# Patient Record
Sex: Female | Born: 1980 | Hispanic: Yes | Marital: Married | Smoking: Never smoker
Health system: Southern US, Community
[De-identification: ages and names within clinical notes are randomized; demographics above are authoritative.]

## PROBLEM LIST (undated history)

## (undated) DIAGNOSIS — N920 Excessive and frequent menstruation with regular cycle: Secondary | ICD-10-CM

## (undated) DIAGNOSIS — K219 Gastro-esophageal reflux disease without esophagitis: Secondary | ICD-10-CM

## (undated) DIAGNOSIS — E538 Deficiency of other specified B group vitamins: Secondary | ICD-10-CM

## (undated) DIAGNOSIS — D250 Submucous leiomyoma of uterus: Secondary | ICD-10-CM

## (undated) HISTORY — PX: BUNIONECTOMY: SHX129

---

## 2016-05-21 ENCOUNTER — Encounter (INDEPENDENT_AMBULATORY_CARE_PROVIDER_SITE_OTHER): Payer: Self-pay

## 2016-05-21 ENCOUNTER — Ambulatory Visit (INDEPENDENT_AMBULATORY_CARE_PROVIDER_SITE_OTHER): Payer: BLUE CROSS/BLUE SHIELD

## 2016-05-21 ENCOUNTER — Ambulatory Visit (INDEPENDENT_AMBULATORY_CARE_PROVIDER_SITE_OTHER): Payer: BLUE CROSS/BLUE SHIELD | Admitting: Orthopedic Surgery

## 2016-05-21 ENCOUNTER — Encounter (INDEPENDENT_AMBULATORY_CARE_PROVIDER_SITE_OTHER): Payer: Self-pay | Admitting: Orthopedic Surgery

## 2016-05-21 DIAGNOSIS — M21611 Bunion of right foot: Secondary | ICD-10-CM | POA: Diagnosis not present

## 2016-05-21 DIAGNOSIS — M21612 Bunion of left foot: Secondary | ICD-10-CM

## 2016-05-21 NOTE — Progress Notes (Signed)
Office Visit Note   Patient: Stacy Petersen           Date of Birth: 10-28-1980           MRN: [id number] Visit Date: 05/21/2016              Requested by: Nanci Pina, MD [address] [place], [place] [address] PCP: Nanci Pina, MD  Chief Complaint  Patient presents with  . Lower Back - Pain  . Right Foot - Follow-up    bunion  . Left Foot - Follow-up    bunion      HPI: Patient is a 36 year old woman who presents with 2 separate issues #1 she has chronic lower back pain and now is having radicular pain into her right buttocks. She has had an MRI scan is reviewed which shows a grade 2 anterior listhesis L5 on S1. Patient does have stenosis area patient also presents for bunion deformity and pain bilateral feet worse on the left than the right. She states she had bunion surgery on the left foot when she was 36 years old and states that this has not provided her any relief or resolution of the deformity.  Assessment & Plan: Visit Diagnoses:  1. Bunion of great toe of right foot   2. Bunion of great toe of left foot     Plan: She will follow up with neurosurgery in [facility] for evaluation for a grade 2 spondylolisthesis. Patient inquires regarding orthopedic surgeons performing the lumbar spine surgery and I discussed that there are several orthopedic doctors in [place] that perform this type of surgery. Patient states that with her bunion pain left worse than right she would like to proceed with surgical intervention.  She would like to proceed with surgery in August after her children have started back to school. Would plan for surgery on the left foot first with a Chevron and Akin osteotomy bunion deformity left great toe MTP joint. Risks and benefits were discussed patient states she understands and wishes to proceed at this time she would then like on the right bunion.  Follow-Up Instructions: Return if symptoms worsen or fail to improve.    Ortho Exam  Patient is alert, oriented, no adenopathy, well-dressed, normal affect, normal respiratory effort. Patient has normal gait she has a palpable pulse bilaterally she has good ankle and subtalar motion negative straight leg raise bilaterally no focal motor weakness in either lower extremity. She has a prominent bunion deformity left worse than the right with a scar on the medial dorsal aspect of the left great toe MTP joint. She has dorsiflexion of both great toes to about 45 but does not have pain with range of motion. There is large bony spurs medially on both feet. There is redness with callus formation bilaterally great toe MTP joint.  Imaging: Xr Foot 2 Views Left  Result Date: 05/21/2016 Two-view radiographs of the left foot shows what appears to be a sclerotic region proximally and the first metatarsal where a opening wedge osteotomy may have been attempted. There is no change in the alignment of the first metatarsal. There is also a distal ostectomy with no evidence of a Chevron osteotomy appears to be more of a non-congruent Silver osteotomy. The joint space is congruent.  Xr Foot 2 Views Right  Result Date: 05/21/2016 Two-view radiographs of the right foot shows a moderate hallux valgus deformity the joint space is congruent bony prominence medially with subluxation of the sesamoids  Labs: No results found for: HGBA1C, ESRSEDRATE, CRP, LABURIC, REPTSTATUS, GRAMSTAIN, CULT, LABORGA  Orders:  Orders Placed This Encounter  Procedures  . XR Foot 2 Views Left  . XR Foot 2 Views Right   No orders of the defined types were placed in this encounter.    Procedures: No procedures performed  Clinical Data: No additional findings.  ROS:  All other systems negative, except as noted in the HPI. Review of Systems  Objective: Vital Signs: There were no vitals taken for this visit.  Specialty Comments:  No specialty comments available.  PMFS History: Patient Active  Problem List   Diagnosis Date Noted  . Bunion of great toe of left foot 05/21/2016  . Bunion of great toe of right foot 05/21/2016   History reviewed. No pertinent past medical history.  History reviewed. No pertinent family history.  History reviewed. No pertinent surgical history. Social History   Occupational History  . Not on file.   Social History Main Topics  . Smoking status: Never Smoker  . Smokeless tobacco: Never Used  . Alcohol use Not on file  . Drug use: Unknown  . Sexual activity: Not on file

## 2016-06-26 ENCOUNTER — Ambulatory Visit (INDEPENDENT_AMBULATORY_CARE_PROVIDER_SITE_OTHER): Payer: Self-pay | Admitting: Orthopaedic Surgery

## 2016-09-18 ENCOUNTER — Telehealth (INDEPENDENT_AMBULATORY_CARE_PROVIDER_SITE_OTHER): Payer: Self-pay | Admitting: Orthopedic Surgery

## 2016-09-18 NOTE — Telephone Encounter (Signed)
Patient called wanting to speak with you about setting up her surgery as soon as possible. Thank you. [phone]

## 2016-09-24 NOTE — Telephone Encounter (Signed)
Can you please review her last office note and complete a surgery sheet for me? She was seen in April, but I do not have one.  Also, she would like cost estimate before scheduling, so we will code and obtain that for her. Thank you

## 2016-09-25 NOTE — Telephone Encounter (Signed)
Blue sheet completed.

## 2016-10-29 ENCOUNTER — Telehealth (INDEPENDENT_AMBULATORY_CARE_PROVIDER_SITE_OTHER): Payer: Self-pay

## 2016-10-29 NOTE — Telephone Encounter (Signed)
Patient would like to know if it would be okay to have Lasik eye surgery on 11/15/16 and also have surgery on Tuesday 11/20/16 with Dr. Sharol Given.  Cb# [phone].  Please advise. Thank You.

## 2016-10-29 NOTE — Telephone Encounter (Signed)
This should be okay. Just have them check and make sure with their ophthalmologist that there is not any restrictions with having surgery several days after her Lasix treatment.

## 2016-10-30 NOTE — Telephone Encounter (Signed)
I called and spoke with patient advising her of the message below.

## 2016-11-20 DIAGNOSIS — M2012 Hallux valgus (acquired), left foot: Secondary | ICD-10-CM | POA: Diagnosis not present

## 2016-11-28 ENCOUNTER — Inpatient Hospital Stay (INDEPENDENT_AMBULATORY_CARE_PROVIDER_SITE_OTHER): Payer: BLUE CROSS/BLUE SHIELD | Admitting: Orthopedic Surgery

## 2016-11-29 ENCOUNTER — Encounter (INDEPENDENT_AMBULATORY_CARE_PROVIDER_SITE_OTHER): Payer: Self-pay | Admitting: Orthopedic Surgery

## 2016-11-29 ENCOUNTER — Ambulatory Visit (INDEPENDENT_AMBULATORY_CARE_PROVIDER_SITE_OTHER): Payer: BLUE CROSS/BLUE SHIELD | Admitting: Orthopedic Surgery

## 2016-11-29 DIAGNOSIS — M21612 Bunion of left foot: Secondary | ICD-10-CM

## 2016-11-29 NOTE — Progress Notes (Signed)
   Office Visit Note   Patient: Stacy Petersen           Date of Birth: [birth year]           MRN: [id number] Visit Date: 11/29/2016              Requested by: Nanci Pina, MD [place], [place] [address] PCP: Nanci Pina, MD  Chief Complaint  Patient presents with  . Left Foot - Routine Post Op    11/20/16 Left Foot GT Chevron Aiken Osteotomy 9 days post op.      HPI: Patient is a 36 year old woman who presents in follow-up status post bunion surgery left great toe with a Chevron osteotomy and Akin osteotomy. Patient is pleased with her toe she states it straight she is not wearing her postoperative shoe.  Assessment & Plan: Visit Diagnoses:  1. Bunion of great toe of left foot     Plan: recommend that she wear the postoperative shoe continue nonweightbearing and use the shoe to protect the toe from blunt trauma. Continue working on dorsiflexion of the ankle. Recommended starting Dial soap cleansing with antibiotic ointment over the pin tracks and over the incision with 4 x 4 and a loosely wrapped Ace wrap. Plan follow-up in 1 week at which time we will remove the sutures and pins.  Follow-Up Instructions: Return in about 1 week (around 12/06/2016).   Ortho Exam  Patient is alert, oriented, no adenopathy, well-dressed, normal affect, normal respiratory effort. Examination the incision is healing quite nicely there is no redness no cellulitis no wound dehiscence noted drainage. Her toe is straight the pin tracks are clean and dry no redness around the pin tracks.  Imaging: No results found. No images are attached to the encounter.  Labs: No results found for: HGBA1C, ESRSEDRATE, CRP, LABURIC, REPTSTATUS, GRAMSTAIN, CULT, LABORGA  Orders:  No orders of the defined types were placed in this encounter.  No orders of the defined types were placed in this encounter.    Procedures: No procedures performed  Clinical Data: No additional  findings.  ROS:  All other systems negative, except as noted in the HPI. Review of Systems  Objective: Vital Signs: There were no vitals taken for this visit.  Specialty Comments:  No specialty comments available.  PMFS History: Patient Active Problem List   Diagnosis Date Noted  . Bunion of great toe of left foot 05/21/2016  . Bunion of great toe of right foot 05/21/2016   History reviewed. No pertinent past medical history.  History reviewed. No pertinent family history.  History reviewed. No pertinent surgical history. Social History   Occupational History  . Not on file.   Social History Main Topics  . Smoking status: Never Smoker  . Smokeless tobacco: Never Used  . Alcohol use Not on file  . Drug use: Unknown  . Sexual activity: Not on file

## 2016-12-06 ENCOUNTER — Ambulatory Visit (INDEPENDENT_AMBULATORY_CARE_PROVIDER_SITE_OTHER): Payer: BLUE CROSS/BLUE SHIELD | Admitting: Orthopedic Surgery

## 2016-12-06 DIAGNOSIS — M21612 Bunion of left foot: Secondary | ICD-10-CM

## 2016-12-06 NOTE — Progress Notes (Signed)
   Office Visit Note   Patient: Stacy Petersen           Date of Birth: 1980-09-09           MRN: [id number] Visit Date: 12/06/2016              Requested by: Nanci Pina, MD [place], [place] [address] PCP: Nanci Pina, MD  No chief complaint on file.     HPI: Patient is a 36 year old woman who presents 2 weeks status post chevron and Akin osteotomy for bunion left great toe.  Assessment & Plan: Visit Diagnoses:  1. Bunion of great toe of left foot     Plan: The pins and sutures were removed.  The importance of scar massage was discussed to minimize risk of scar keloiding.  Patient states she does have a tendency to keloid.  She may advance weightbearing as tolerated with her postoperative shoe and crutches.  Patient was given a note to continue her out of work note for 4 weeks.  We will need to complete her FMLA to agree with her new out of work date.  Follow-Up Instructions: Return in about 2 weeks (around 12/20/2016).   Ortho Exam  Patient is alert, oriented, no adenopathy, well-dressed, normal affect, normal respiratory effort. Examination incision is well-healed there is no keloiding no redness no cellulitis no signs of infection.  The pins were removed her toe was straight a small spacer was placed in the first webspace.  Imaging: No results found. No images are attached to the encounter.  Labs: No results found for: HGBA1C, ESRSEDRATE, CRP, LABURIC, REPTSTATUS, GRAMSTAIN, CULT, LABORGA  Orders:  No orders of the defined types were placed in this encounter.  No orders of the defined types were placed in this encounter.    Procedures: No procedures performed  Clinical Data: No additional findings.  ROS:  All other systems negative, except as noted in the HPI. Review of Systems  Objective: Vital Signs: There were no vitals taken for this visit.  Specialty Comments:  No specialty comments available.  PMFS  History: Patient Active Problem List   Diagnosis Date Noted  . Bunion of great toe of left foot 05/21/2016  . Bunion of great toe of right foot 05/21/2016   No past medical history on file.  No family history on file.  No past surgical history on file. Social History   Occupational History  . Not on file.   Social History Main Topics  . Smoking status: Never Smoker  . Smokeless tobacco: Never Used  . Alcohol use Not on file  . Drug use: Unknown  . Sexual activity: Not on file

## 2016-12-17 ENCOUNTER — Telehealth (INDEPENDENT_AMBULATORY_CARE_PROVIDER_SITE_OTHER): Payer: Self-pay | Admitting: Orthopedic Surgery

## 2016-12-17 NOTE — Telephone Encounter (Signed)
refaxed 12/06/2016 disability forms to [employer]. Originally faxed by [employer] [phone]

## 2016-12-20 ENCOUNTER — Encounter (INDEPENDENT_AMBULATORY_CARE_PROVIDER_SITE_OTHER): Payer: Self-pay | Admitting: Orthopedic Surgery

## 2016-12-20 ENCOUNTER — Ambulatory Visit (INDEPENDENT_AMBULATORY_CARE_PROVIDER_SITE_OTHER): Payer: BLUE CROSS/BLUE SHIELD | Admitting: Orthopedic Surgery

## 2016-12-20 DIAGNOSIS — M21612 Bunion of left foot: Secondary | ICD-10-CM

## 2016-12-20 NOTE — Progress Notes (Signed)
   Office Visit Note   Patient: Stacy Petersen           Date of Birth: October 06, 1980           MRN: [id number] Visit Date: 12/20/2016              Requested by: Nanci Pina, MD [place], [place] [address] PCP: Nanci Pina, MD  Chief Complaint  Patient presents with  . Right Foot - Follow-up    11/20/16 Chevron & Aiken Osteotomy L GT      HPI: Patient is a 36 year old woman who presents 4 weeks status post chevron and Akin osteotomy left great toe for bunion deformity.  Patient states that she stands for more than 30 minutes she has increased swelling and purple discoloration of the skin.  She is currently full weightbearing in a postoperative shoe she states that her foot will not fit into regular sneakers yet.  Assessment & Plan: Visit Diagnoses:  1. Bunion of great toe of left foot     Plan: Continue with the postoperative shoe continue with scar massage work on heel cord stretching follow-up in 2 weeks at that time we will evaluate her ability to return to work she states she is on her feet a lot during the day.  She could not return to work in her current condition out of work for at least 2 weeks.  Follow-Up Instructions: Return in about 2 weeks (around 01/03/2017).   Ortho Exam  Patient is alert, oriented, no adenopathy, well-dressed, normal affect, normal respiratory effort. Examination the incision is well-healed she does have swelling in the forefoot.  There is no purple discoloration this morning.  She has good pulses she does have a little bit heel cord tightness and she was given instructions for heel cord stretching she will work on scar massage she has a little bit of stiffness from the swelling of the great toe and she will work on range of motion of the great toe.  She has good alignment of the toe with no clinical angular deformity.  Do not feel x-rays are necessary at this time.  Imaging: No results found. No images are  attached to the encounter.  Labs: No results found for: HGBA1C, ESRSEDRATE, CRP, LABURIC, REPTSTATUS, GRAMSTAIN, CULT, LABORGA  Orders:  No orders of the defined types were placed in this encounter.  No orders of the defined types were placed in this encounter.    Procedures: No procedures performed  Clinical Data: No additional findings.  ROS:  All other systems negative, except as noted in the HPI. Review of Systems  Objective: Vital Signs: There were no vitals taken for this visit.  Specialty Comments:  No specialty comments available.  PMFS History: Patient Active Problem List   Diagnosis Date Noted  . Bunion of great toe of left foot 05/21/2016  . Bunion of great toe of right foot 05/21/2016   History reviewed. No pertinent past medical history.  History reviewed. No pertinent family history.  History reviewed. No pertinent surgical history. Social History   Occupational History  . Not on file  Tobacco Use  . Smoking status: Never Smoker  . Smokeless tobacco: Never Used  Substance and Sexual Activity  . Alcohol use: Not on file  . Drug use: Not on file  . Sexual activity: Not on file

## 2017-01-07 ENCOUNTER — Ambulatory Visit (INDEPENDENT_AMBULATORY_CARE_PROVIDER_SITE_OTHER): Payer: BLUE CROSS/BLUE SHIELD | Admitting: Orthopedic Surgery

## 2017-01-07 ENCOUNTER — Encounter (INDEPENDENT_AMBULATORY_CARE_PROVIDER_SITE_OTHER): Payer: Self-pay | Admitting: Orthopedic Surgery

## 2017-01-07 DIAGNOSIS — M21612 Bunion of left foot: Secondary | ICD-10-CM

## 2017-01-07 NOTE — Progress Notes (Signed)
   Office Visit Note   Patient: Stacy Petersen           Date of Birth: May 28, 1980           MRN: [id number] Visit Date: 01/07/2017              Requested by: Nanci Pina, MD [place], [place] [address] PCP: Nanci Pina, MD  Chief Complaint  Patient presents with  . Left Foot - Routine Post Op    11/20/16  chevron and aiken osteotomy GT      HPI: Patient is a 36 year old woman who presents 6 weeks status post a left foot chevron and Akin osteotomy.  Patient is in regular shoewear.  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  Assessment & Plan: Visit Diagnoses:  1. Bunion of great toe of left foot     Plan: Patient was given a note that she may return to work on January 15, 2017 no restrictions she may rest her foot as needed.  Follow-Up Instructions: Return if symptoms worsen or fail to improve.   Ortho Exam  Patient is alert, oriented, no adenopathy, well-dressed, normal affect, normal respiratory effort. Examination patient does have some increased swelling in her left foot.  There is no redness no cellulitis no signs of infection.  She has a normal gait with regular shoewear.  Imaging: No results found. No images are attached to the encounter.  Labs: No results found for: HGBA1C, ESRSEDRATE, CRP, LABURIC, REPTSTATUS, GRAMSTAIN, CULT, LABORGA  @LABSALLVALUES (HGBA1)@  @BMI1 @  Orders:  No orders of the defined types were placed in this encounter.  No orders of the defined types were placed in this encounter.    Procedures: No procedures performed  Clinical Data: No additional findings.  ROS:  All other systems negative, except as noted in the HPI. Review of Systems  Objective: Vital Signs: There were no vitals taken for this visit.  Specialty Comments:  No specialty  comments available.  PMFS History: Patient Active Problem List   Diagnosis Date Noted  . Bunion of great toe of left foot 05/21/2016  . Bunion of great toe of right foot 05/21/2016   History reviewed. No pertinent past medical history.  History reviewed. No pertinent family history.  History reviewed. No pertinent surgical history. Social History   Occupational History  . Not on file  Tobacco Use  . Smoking status: Never Smoker  . Smokeless tobacco: Never Used  Substance and Sexual Activity  . Alcohol use: Not on file  . Drug use: Not on file  . Sexual activity: Not on file

## 2020-08-26 ENCOUNTER — Other Ambulatory Visit: Payer: Self-pay | Admitting: Physical Medicine and Rehabilitation

## 2020-08-26 DIAGNOSIS — M5416 Radiculopathy, lumbar region: Secondary | ICD-10-CM

## 2020-09-03 ENCOUNTER — Other Ambulatory Visit: Payer: Self-pay | Admitting: Physical Medicine and Rehabilitation

## 2020-09-03 DIAGNOSIS — M5416 Radiculopathy, lumbar region: Secondary | ICD-10-CM

## 2020-09-06 ENCOUNTER — Ambulatory Visit: Payer: BLUE CROSS/BLUE SHIELD

## 2020-09-12 ENCOUNTER — Other Ambulatory Visit: Payer: Self-pay

## 2020-09-12 ENCOUNTER — Ambulatory Visit
Admission: RE | Admit: 2020-09-12 | Discharge: 2020-09-12 | Disposition: A | Discharge status: Home / Self Care | Payer: Self-pay | Source: Ambulatory Visit | Attending: Physical Medicine and Rehabilitation | Admitting: Physical Medicine and Rehabilitation

## 2020-09-12 DIAGNOSIS — M5416 Radiculopathy, lumbar region: Secondary | ICD-10-CM

## 2021-12-18 ENCOUNTER — Encounter (HOSPITAL_BASED_OUTPATIENT_CLINIC_OR_DEPARTMENT_OTHER): Payer: Self-pay | Admitting: Obstetrics and Gynecology

## 2021-12-18 NOTE — Progress Notes (Signed)
Spoke w/ via phone for pre-op interview--- pt Lab needs dos----  cbc, urine preg             Lab results------ no COVID test -----patient states asymptomatic no test needed Arrive at ------- 0630 on 12-20-2021 NPO after MN NO Solid Food.  Clear liquids from MN until--- 0530 Med rec completed Medications to take morning of surgery ----- none Diabetic medication ----- n/a Patient instructed no nail polish to be worn day of surgery Patient instructed to bring photo id and insurance card day of surgery Patient aware to have Driver (ride ) / caregiver for 24 hours after surgery  --- husband,  Stacy Petersen Patient Special Instructions ----- n/a Pre-Op special Istructions ----- n/a Patient verbalized understanding of instructions that were given at this phone interview. Patient denies shortness of breath, chest pain, fever, cough at this phone interview.

## 2021-12-19 NOTE — H&P (Signed)
Stacy Petersen is an 41 y.o. female G3P3 who presents for scheduled hysteroscopic myomectomy for menorrhagia. Pt reports daily dull pelvic and back pain that can be worse with bleeding. Menses are  q 23-24 days and beginning about a year ago began to be heavy and last about 10 days. . She has been married 20 years, can have pain with intercourse.  Husband has had vasectomy. Pt had SIUS with multiple fibroids 2-3 cm with 3 fibroids with submucosal component on anterior and posterior walls.  Pertinent Gynecological History:  OB History: NSVD x 3  Menstrual History:  Patient's last menstrual period was 11/22/2021 (exact date).    Past Medical History:  Diagnosis Date   B12 deficiency    GERD (gastroesophageal reflux disease)    Menorrhagia    Submucous leiomyoma of uterus     Past Surgical History:  Procedure Laterality Date   BUNIONECTOMY Left    10/ 2018  and re-do same year;  by dr duda    History reviewed. No pertinent family history.  Social History:  reports that she has never smoked. She has never used smokeless tobacco. She reports current alcohol use. She reports that she does not use drugs.  Allergies: No Known Allergies  No medications prior to admission.    Review of Systems  Constitutional:  Negative for fever.  Gastrointestinal:  Negative for abdominal pain.  Genitourinary:  Positive for menstrual problem. Negative for vaginal bleeding.    Height '5\' 3"'$  (1.6 m), weight 67.6 kg, last menstrual period 11/22/2021. Physical Exam Constitutional:      Appearance: Normal appearance.  Cardiovascular:     Rate and Rhythm: Normal rate and regular rhythm.  Pulmonary:     Effort: Pulmonary effort is normal.  Abdominal:     General: Abdomen is flat.     Palpations: Abdomen is soft.  Genitourinary:    General: Normal vulva.  Neurological:     General: No focal deficit present.     Mental Status: She is alert.  Psychiatric:        Mood and Affect: Mood normal.      No results found for this or any previous visit (from the past 24 hour(s)).  No results found.  Assessment/Plan: The patient was counseled regarding the hysteroscopy procedure in detail.  We reviewed risks of bleeding and infection and possible uterine perforation.  We also discussed the removal of any identified polyps or submucosal fibroids and what that would involve.  The patient desires to proceed.  She will use cytotec 434mg 3 hours prior to the procedure to aid with cervical dilation.  KLogan Bores11/08/2021, 2:49 AM

## 2021-12-20 ENCOUNTER — Ambulatory Visit (HOSPITAL_BASED_OUTPATIENT_CLINIC_OR_DEPARTMENT_OTHER): Payer: Managed Care, Other (non HMO) | Admitting: Anesthesiology

## 2021-12-20 ENCOUNTER — Encounter (HOSPITAL_BASED_OUTPATIENT_CLINIC_OR_DEPARTMENT_OTHER)
Admission: RE | Disposition: A | Discharge status: Home / Self Care | Payer: Self-pay | Source: Home / Self Care | Attending: Obstetrics and Gynecology

## 2021-12-20 ENCOUNTER — Encounter (HOSPITAL_BASED_OUTPATIENT_CLINIC_OR_DEPARTMENT_OTHER): Payer: Self-pay | Admitting: Obstetrics and Gynecology

## 2021-12-20 ENCOUNTER — Other Ambulatory Visit: Payer: Self-pay

## 2021-12-20 ENCOUNTER — Ambulatory Visit (HOSPITAL_BASED_OUTPATIENT_CLINIC_OR_DEPARTMENT_OTHER)
Admission: RE | Admit: 2021-12-20 | Discharge: 2021-12-20 | Disposition: A | Discharge status: Home / Self Care | Payer: Managed Care, Other (non HMO) | Attending: Obstetrics and Gynecology | Admitting: Obstetrics and Gynecology

## 2021-12-20 DIAGNOSIS — N920 Excessive and frequent menstruation with regular cycle: Secondary | ICD-10-CM | POA: Diagnosis present

## 2021-12-20 DIAGNOSIS — D250 Submucous leiomyoma of uterus: Secondary | ICD-10-CM | POA: Diagnosis not present

## 2021-12-20 DIAGNOSIS — K219 Gastro-esophageal reflux disease without esophagitis: Secondary | ICD-10-CM | POA: Insufficient documentation

## 2021-12-20 DIAGNOSIS — Z01818 Encounter for other preprocedural examination: Secondary | ICD-10-CM

## 2021-12-20 HISTORY — DX: Excessive and frequent menstruation with regular cycle: N92.0

## 2021-12-20 HISTORY — DX: Submucous leiomyoma of uterus: D25.0

## 2021-12-20 HISTORY — PX: DILATATION & CURETTAGE/HYSTEROSCOPY WITH MYOSURE: SHX6511

## 2021-12-20 HISTORY — DX: Deficiency of other specified B group vitamins: E53.8

## 2021-12-20 HISTORY — DX: Gastro-esophageal reflux disease without esophagitis: K21.9

## 2021-12-20 LAB — CBC
HCT: 44.1 % (ref 36.0–46.0)
Hemoglobin: 13.7 g/dL (ref 12.0–15.0)
MCH: 26.4 pg (ref 26.0–34.0)
MCHC: 31.1 g/dL (ref 30.0–36.0)
MCV: 85 fL (ref 80.0–100.0)
Platelets: 210 10*3/uL (ref 150–400)
RBC: 5.19 MIL/uL — ABNORMAL HIGH (ref 3.87–5.11)
RDW: 13.2 % (ref 11.5–15.5)
WBC: 6.5 10*3/uL (ref 4.0–10.5)
nRBC: 0 % (ref 0.0–0.2)

## 2021-12-20 LAB — POCT PREGNANCY, URINE: Preg Test, Ur: NEGATIVE

## 2021-12-20 SURGERY — DILATATION & CURETTAGE/HYSTEROSCOPY WITH MYOSURE
Anesthesia: General | Site: Vagina

## 2021-12-20 MED ORDER — ONDANSETRON HCL 4 MG/2ML IJ SOLN
INTRAMUSCULAR | Status: AC
  Filled 2021-12-20: qty 2

## 2021-12-20 MED ORDER — MIDAZOLAM HCL 2 MG/2ML IJ SOLN
INTRAMUSCULAR | Status: AC
  Filled 2021-12-20: qty 2

## 2021-12-20 MED ORDER — FENTANYL CITRATE (PF) 100 MCG/2ML IJ SOLN
INTRAMUSCULAR | Status: AC
  Filled 2021-12-20: qty 2

## 2021-12-20 MED ORDER — SILVER NITRATE-POT NITRATE 75-25 % EX MISC
CUTANEOUS | Status: DC | PRN
  Administered 2021-12-20: 2

## 2021-12-20 MED ORDER — PROPOFOL 10 MG/ML IV BOLUS
INTRAVENOUS | Status: AC
  Filled 2021-12-20: qty 20

## 2021-12-20 MED ORDER — LIDOCAINE HCL (PF) 2 % IJ SOLN
INTRAMUSCULAR | Status: AC
  Filled 2021-12-20: qty 5

## 2021-12-20 MED ORDER — ONDANSETRON HCL 4 MG/2ML IJ SOLN
INTRAMUSCULAR | Status: DC | PRN
  Administered 2021-12-20: 4 mg via INTRAVENOUS

## 2021-12-20 MED ORDER — GLYCOPYRROLATE PF 0.2 MG/ML IJ SOSY
PREFILLED_SYRINGE | INTRAMUSCULAR | Status: AC
  Filled 2021-12-20: qty 1

## 2021-12-20 MED ORDER — TRANEXAMIC ACID-NACL 1000-0.7 MG/100ML-% IV SOLN
INTRAVENOUS | Status: AC
  Filled 2021-12-20: qty 100

## 2021-12-20 MED ORDER — SODIUM CHLORIDE 0.9 % IR SOLN
Status: DC | PRN
  Administered 2021-12-20 (×2): 3000 mL
  Administered 2021-12-20: 1500 mL

## 2021-12-20 MED ORDER — LACTATED RINGERS IV SOLN: INTRAVENOUS | Status: DC

## 2021-12-20 MED ORDER — LIDOCAINE 2% (20 MG/ML) 5 ML SYRINGE
INTRAMUSCULAR | Status: DC | PRN
  Administered 2021-12-20: 60 mg via INTRAVENOUS

## 2021-12-20 MED ORDER — GLYCOPYRROLATE 0.2 MG/ML IJ SOLN
INTRAMUSCULAR | Status: DC | PRN
  Administered 2021-12-20: .2 mg via INTRAVENOUS

## 2021-12-20 MED ORDER — DEXAMETHASONE SODIUM PHOSPHATE 4 MG/ML IJ SOLN
INTRAMUSCULAR | Status: DC | PRN
  Administered 2021-12-20: 5 mg via INTRAVENOUS

## 2021-12-20 MED ORDER — DEXAMETHASONE SODIUM PHOSPHATE 10 MG/ML IJ SOLN
INTRAMUSCULAR | Status: AC
  Filled 2021-12-20: qty 1

## 2021-12-20 MED ORDER — PROPOFOL 10 MG/ML IV BOLUS
INTRAVENOUS | Status: DC | PRN
  Administered 2021-12-20: 200 ug via INTRAVENOUS

## 2021-12-20 MED ORDER — TRANEXAMIC ACID 1000 MG/10ML IV SOLN: INTRAVENOUS | Status: DC | PRN

## 2021-12-20 MED ORDER — KETOROLAC TROMETHAMINE 30 MG/ML IJ SOLN
INTRAMUSCULAR | Status: DC | PRN
  Administered 2021-12-20: 30 mg via INTRAVENOUS

## 2021-12-20 MED ORDER — HYDROMORPHONE HCL 1 MG/ML IJ SOLN: 0.2500 mg | INTRAMUSCULAR | Status: DC | PRN

## 2021-12-20 MED ORDER — EPHEDRINE 5 MG/ML INJ
INTRAVENOUS | Status: AC
  Filled 2021-12-20: qty 5

## 2021-12-20 MED ORDER — FENTANYL CITRATE (PF) 100 MCG/2ML IJ SOLN
INTRAMUSCULAR | Status: DC | PRN
  Administered 2021-12-20 (×4): 50 ug via INTRAVENOUS

## 2021-12-20 MED ORDER — LIDOCAINE HCL 1 % IJ SOLN
INTRAMUSCULAR | Status: DC | PRN
  Administered 2021-12-20: 20 mL

## 2021-12-20 MED ORDER — PHENYLEPHRINE 80 MCG/ML (10ML) SYRINGE FOR IV PUSH (FOR BLOOD PRESSURE SUPPORT)
PREFILLED_SYRINGE | INTRAVENOUS | Status: AC
  Filled 2021-12-20: qty 10

## 2021-12-20 MED ORDER — PHENYLEPHRINE HCL (PRESSORS) 10 MG/ML IV SOLN
INTRAVENOUS | Status: DC | PRN
  Administered 2021-12-20 (×4): 80 ug via INTRAVENOUS

## 2021-12-20 MED ORDER — KETOROLAC TROMETHAMINE 30 MG/ML IJ SOLN
INTRAMUSCULAR | Status: AC
  Filled 2021-12-20: qty 1

## 2021-12-20 MED ORDER — POVIDONE-IODINE 10 % EX SWAB: 2.0000 | Freq: Once | CUTANEOUS | Status: DC

## 2021-12-20 MED ORDER — MIDAZOLAM HCL 5 MG/5ML IJ SOLN
INTRAMUSCULAR | Status: DC | PRN
  Administered 2021-12-20: 2 mg via INTRAVENOUS

## 2021-12-20 MED ORDER — TRANEXAMIC ACID-NACL 1000-0.7 MG/100ML-% IV SOLN
INTRAVENOUS | Status: DC | PRN
  Administered 2021-12-20: 1000 mg via INTRAVENOUS

## 2021-12-20 MED ORDER — EPHEDRINE SULFATE (PRESSORS) 50 MG/ML IJ SOLN
INTRAMUSCULAR | Status: DC | PRN
  Administered 2021-12-20: 5 mg via INTRAVENOUS
  Administered 2021-12-20 (×2): 10 mg via INTRAVENOUS

## 2021-12-20 SURGICAL SUPPLY — 15 items
GLOVE BIO SURGEON STRL SZ 6.5 (GLOVE) ×1 IMPLANT
GLOVE BIO SURGEON STRL SZ7 (GLOVE) ×1 IMPLANT
GOWN STRL REUS W/TWL LRG LVL3 (GOWN DISPOSABLE) ×1 IMPLANT
IV NS IRRIG 3000ML ARTHROMATIC (IV SOLUTION) ×1 IMPLANT
KIT PROCEDURE FLUENT (KITS) ×1 IMPLANT
KIT TURNOVER CYSTO (KITS) ×1 IMPLANT
MYOSURE XL FIBROID (MISCELLANEOUS) ×1
NDL SPNL 22GX3.5 QUINCKE BK (NEEDLE) ×1 IMPLANT
NEEDLE SPNL 22GX3.5 QUINCKE BK (NEEDLE) ×1 IMPLANT
PACK VAGINAL MINOR WOMEN LF (CUSTOM PROCEDURE TRAY) ×1 IMPLANT
PAD OB MATERNITY 4.3X12.25 (PERSONAL CARE ITEMS) ×1 IMPLANT
SEAL ROD LENS SCOPE MYOSURE (ABLATOR) ×1 IMPLANT
SOL PREP POV-IOD 4OZ 10% (MISCELLANEOUS) IMPLANT
SYSTEM TISS REMOVAL MYOSURE XL (MISCELLANEOUS) IMPLANT
TOWEL OR 17X24 6PK STRL BLUE (TOWEL DISPOSABLE) IMPLANT

## 2021-12-20 NOTE — Anesthesia Postprocedure Evaluation (Signed)
Anesthesia Post Note  Patient: Stacy Petersen  Procedure(s) Performed: DILATATION & CURETTAGE/HYSTEROSCOPY WITH MYOSURE (Vagina )     Anesthesia Type: General Anesthetic complications: no   No notable events documented.  Last Vitals:  Vitals:   12/20/21 0702  BP: 113/77  Pulse: 71  Resp: 16  Temp: 36.8 C  SpO2: 99%    Last Pain:  Vitals:   12/20/21 0702  TempSrc: Oral  PainSc: 2                  Ellissa Ayo

## 2021-12-20 NOTE — Interval H&P Note (Signed)
History and Physical Interval Note:  12/20/2021 8:10 AM  [name]  has presented today for surgery, with the diagnosis of menorrhagia, leiomyoma of uterus.  The various methods of treatment have been discussed with the patient and family. After consideration of risks, benefits and other options for treatment, the patient has consented to  Procedure(s): [name] (N/A) as a surgical intervention.  The patient's history has been reviewed, patient examined, no change in status, stable for surgery.  I have reviewed the patient's chart and labs.  Questions were answered to the patient's satisfaction.     Stacy Petersen

## 2021-12-20 NOTE — Op Note (Signed)
Operative Note    Preoperative Diagnosis Menorrhagia Multiple submucosal fibroids  Postoperative Diagnosis Same   Procedure Hysteroscopic myomectomy with Myosure XL  Surgeon Paula Compton, MD  Anesthesia  Fluids: EBL 137m UOP voided prior to procedure IVF   10036mLR Hysteroscopic deficit 239535mFindings The uterine cavity had 6 submucosal fibroids ranging in size from 1cm to 3cm.  All but one on the upper left fundus were resected, and that last one was resected about 2/3 before needing to stop for deficit  Specimen Fibroid fragments and endometrial lining  Procedure Note Patient was taken to the operating room where LMA anesthesia was obtained without difficulty. She was then prepped and draped in the normal sterile fashion in the dorsal lithotomy position. An appropriate time out was performed. A speculum was then placed within the vagina and the anterior lip of the cervix identified and injected with approximately 2 cc of 1% plain lidocaine. An additional 9 cc each was placed at 2 and 10:00 for a paracervical block. Uterus was then sounded to approximately 8-9 cm and the [facility] utilized to dilate the cervix up to approximately 24. The Myosure XL operating scope was then introduced into the cervix and the cavity inspected with findings as previously noted. The Myosure XL operating blade was then introduced through the scope and under direct visualization the fibroids were sequentially resected.   There were small bleeding vessels noted during the removal and accounted for the deficit which slowly accumulated. When the deficit reached 2395 the procedure was terminated. The only partially residual fibroid was in the upper left fundus on the anterior wall. The operating scope was then removed and all tissue specimens were handed off to pathology. The tenaculum was then removed from the cervix and the site rendered hemostatic with silver nitrate. There was some oozing from  the os so the patient was given TXA and the bleeding became minimal. Finally the speculum was removed from the vagina and the patient awakened and taken to the recovery room in good condition.

## 2021-12-20 NOTE — Anesthesia Postprocedure Evaluation (Signed)
Anesthesia Post Note  Patient: Stacy Petersen  Procedure(s) Performed: DILATATION & CURETTAGE/HYSTEROSCOPY WITH MYOSURE (Vagina )     Patient location during evaluation: PACU Anesthesia Type: General Level of consciousness: awake Pain management: pain level controlled Vital Signs Assessment: post-procedure vital signs reviewed and stable Respiratory status: spontaneous breathing Cardiovascular status: stable Postop Assessment: no apparent nausea or vomiting Anesthetic complications: no   No notable events documented.  Last Vitals:  Vitals:   12/20/21 1021 12/20/21 1050  BP:  116/84  Pulse: 100 93  Resp: 15 15  Temp:  (!) 36.3 C  SpO2: 97% 98%    Last Pain:  Vitals:   12/20/21 1050  TempSrc:   PainSc: 2                  Kemyah Buser

## 2021-12-20 NOTE — Transfer of Care (Signed)
Immediate Anesthesia Transfer of Care Note  Patient: Stacy Petersen  Procedure(s) Performed: Procedure(s) (LRB): DILATATION & CURETTAGE/HYSTEROSCOPY WITH MYOSURE (N/A)  Patient Location: PACU  Anesthesia Type: General  Level of Consciousness: awake, sedated, patient cooperative and responds to stimulation  Airway & Oxygen Therapy: Patient Spontanous Breathing and Patient connected to [place] oxygen  Post-op Assessment: Report given to PACU RN, Post -op Vital signs reviewed and stable and Patient moving all extremities  Post vital signs: Reviewed and stable  Complications: No apparent anesthesia complications

## 2021-12-20 NOTE — Anesthesia Preprocedure Evaluation (Signed)
Anesthesia Evaluation  Patient identified by MRN, date of birth, ID band Patient awake    Reviewed: Allergy & Precautions, NPO status , Patient's Chart, lab work & pertinent test results  Airway Mallampati: II       Dental   Pulmonary neg pulmonary ROS   breath sounds clear to auscultation       Cardiovascular negative cardio ROS  Rhythm:Regular Rate:Normal     Neuro/Psych negative neurological ROS     GI/Hepatic Neg liver ROS,GERD  ,,  Endo/Other  negative endocrine ROS    Renal/GU negative Renal ROS     Musculoskeletal   Abdominal   Peds  Hematology   Anesthesia Other Findings   Reproductive/Obstetrics                             Anesthesia Physical Anesthesia Plan  ASA: 2  Anesthesia Plan: General   Post-op Pain Management: Tylenol PO (pre-op)*   Induction:   PONV Risk Score and Plan: 3 and Ondansetron, Dexamethasone and Midazolam  Airway Management Planned: LMA  Additional Equipment:   Intra-op Plan:   Post-operative Plan: Extubation in OR  Informed Consent: I have reviewed the patients History and Physical, chart, labs and discussed the procedure including the risks, benefits and alternatives for the proposed anesthesia with the patient or authorized representative who has indicated his/her understanding and acceptance.     Dental advisory given  Plan Discussed with: CRNA and Anesthesiologist  Anesthesia Plan Comments:        Anesthesia Quick Evaluation

## 2021-12-20 NOTE — Anesthesia Procedure Notes (Signed)
Procedure Name: LMA Insertion Date/Time: 12/20/2021 8:32 AM  Performed by: Justice Rocher, CRNAPre-anesthesia Checklist: Patient identified, Emergency Drugs available, Suction available, Patient being monitored and Timeout performed Patient Re-evaluated:Patient Re-evaluated prior to induction Oxygen Delivery Method: Circle system utilized Preoxygenation: Pre-oxygenation with 100% oxygen Induction Type: IV induction Ventilation: Mask ventilation without difficulty LMA: LMA inserted LMA Size: 4.0 Number of attempts: 1 Airway Equipment and Method: Bite block Placement Confirmation: positive ETCO2, breath sounds checked- equal and bilateral and CO2 detector Tube secured with: Tape Dental Injury: Teeth and Oropharynx as per pre-operative assessment

## 2021-12-21 ENCOUNTER — Encounter (HOSPITAL_BASED_OUTPATIENT_CLINIC_OR_DEPARTMENT_OTHER): Payer: Self-pay | Admitting: Obstetrics and Gynecology

## 2021-12-21 LAB — SURGICAL PATHOLOGY

## 2022-04-20 ENCOUNTER — Emergency Department (HOSPITAL_COMMUNITY): Payer: Medicaid Other

## 2022-04-20 ENCOUNTER — Emergency Department (HOSPITAL_COMMUNITY)
Admission: EM | Admit: 2022-04-20 | Discharge: 2022-04-20 | Disposition: A | Discharge status: Home / Self Care | Payer: Medicaid Other | Attending: Emergency Medicine | Admitting: Emergency Medicine

## 2022-04-20 ENCOUNTER — Encounter (HOSPITAL_COMMUNITY): Payer: Self-pay | Admitting: *Deleted

## 2022-04-20 DIAGNOSIS — R1031 Right lower quadrant pain: Secondary | ICD-10-CM | POA: Insufficient documentation

## 2022-04-20 DIAGNOSIS — N898 Other specified noninflammatory disorders of vagina: Secondary | ICD-10-CM | POA: Insufficient documentation

## 2022-04-20 LAB — COMPREHENSIVE METABOLIC PANEL
ALT: 16 U/L (ref 0–44)
AST: 22 U/L (ref 15–41)
Albumin: 4.5 g/dL (ref 3.5–5.0)
Alkaline Phosphatase: 48 U/L (ref 38–126)
Anion gap: 10 (ref 5–15)
BUN: 12 mg/dL (ref 6–20)
CO2: 22 mmol/L (ref 22–32)
Calcium: 9.1 mg/dL (ref 8.9–10.3)
Chloride: 104 mmol/L (ref 98–111)
Creatinine, Ser: 0.81 mg/dL (ref 0.44–1.00)
GFR, Estimated: >60 mL/min (ref >60)
Glucose, Bld: 87 mg/dL (ref 70–99)
Potassium: 3.7 mmol/L (ref 3.5–5.1)
Sodium: 136 mmol/L (ref 135–145)
Total Bilirubin: 1.3 mg/dL — ABNORMAL HIGH (ref 0.3–1.2)
Total Protein: 7.4 g/dL (ref 6.5–8.1)

## 2022-04-20 LAB — CBC WITH DIFFERENTIAL/PLATELET
Abs Immature Granulocytes: 0.02 10*3/uL (ref 0.00–0.07)
Basophils Absolute: 0 10*3/uL (ref 0.0–0.1)
Basophils Relative: 0 %
Eosinophils Absolute: 0.1 10*3/uL (ref 0.0–0.5)
Eosinophils Relative: 1 %
HCT: 42.3 % (ref 36.0–46.0)
Hemoglobin: 13.4 g/dL (ref 12.0–15.0)
Immature Granulocytes: 0 %
Lymphocytes Relative: 38 %
Lymphs Abs: 2.7 10*3/uL (ref 0.7–4.0)
MCH: 26.4 pg (ref 26.0–34.0)
MCHC: 31.7 g/dL (ref 30.0–36.0)
MCV: 83.4 fL (ref 80.0–100.0)
Monocytes Absolute: 0.4 10*3/uL (ref 0.1–1.0)
Monocytes Relative: 6 %
Neutro Abs: 3.8 10*3/uL (ref 1.7–7.7)
Neutrophils Relative %: 55 %
Platelets: 226 10*3/uL (ref 150–400)
RBC: 5.07 MIL/uL (ref 3.87–5.11)
RDW: 13.4 % (ref 11.5–15.5)
WBC: 7 10*3/uL (ref 4.0–10.5)
nRBC: 0 % (ref 0.0–0.2)

## 2022-04-20 LAB — URINALYSIS, ROUTINE W REFLEX MICROSCOPIC
Bilirubin Urine: NEGATIVE
Glucose, UA: NEGATIVE mg/dL
Hgb urine dipstick: NEGATIVE
Ketones, ur: NEGATIVE mg/dL
Leukocytes,Ua: NEGATIVE
Nitrite: NEGATIVE
Protein, ur: NEGATIVE mg/dL
Specific Gravity, Urine: 1.012 (ref 1.005–1.030)
pH: 7 (ref 5.0–8.0)

## 2022-04-20 LAB — WET PREP, GENITAL
Clue Cells Wet Prep HPF POC: NONE SEEN
Sperm: NONE SEEN
Trich, Wet Prep: NONE SEEN
WBC, Wet Prep HPF POC: <10 (ref <10)
Yeast Wet Prep HPF POC: NONE SEEN

## 2022-04-20 LAB — LIPASE, BLOOD: Lipase: 40 U/L (ref 11–51)

## 2022-04-20 LAB — I-STAT BETA HCG BLOOD, ED (MC, WL, AP ONLY): I-stat hCG, quantitative: <5 m[IU]/mL (ref <5)

## 2022-04-20 MED ORDER — STERILE WATER FOR INJECTION IJ SOLN
INTRAMUSCULAR | Status: AC
  Filled 2022-04-20: qty 10

## 2022-04-20 MED ORDER — IOHEXOL 300 MG/ML  SOLN
100.0000 mL | Freq: Once | INTRAMUSCULAR | Status: AC | PRN
  Administered 2022-04-20: 100 mL via INTRAVENOUS

## 2022-04-20 MED ORDER — DOXYCYCLINE HYCLATE 100 MG PO CAPS: 100.0000 mg | ORAL_CAPSULE | Freq: Two times a day (BID) | ORAL | 0 refills | Status: AC

## 2022-04-20 MED ORDER — DOXYCYCLINE HYCLATE 100 MG PO TABS
100.0000 mg | ORAL_TABLET | Freq: Once | ORAL | Status: AC
  Administered 2022-04-20: 100 mg via ORAL
  Filled 2022-04-20: qty 1

## 2022-04-20 MED ORDER — CEFTRIAXONE SODIUM 1 G IJ SOLR
500.0000 mg | Freq: Once | INTRAMUSCULAR | Status: AC
  Administered 2022-04-20: 500 mg via INTRAMUSCULAR
  Filled 2022-04-20: qty 10

## 2022-04-20 NOTE — ED Provider Notes (Signed)
Patient signed out to me from prior provider, see their note for complete history.  patient has been having vaginal discharge and right lower quadrant pain, CT abdomen was pending at time of shift change.    CT abdomen is viewed by myself, negative for appendicitis.  Suspect pelvic inflammatory disease, Rocephin and doxycycline were ordered as well as outpatient.  She had a lesion on her right kidney which discussed with the patient she is aware she will need to have outpatient follow-up for this.  Copy of CT result was provided.  Also discussed that there are multiple fibroids noted on the uterus that she will follow-up with her OB/GYN.  Presentation does not appear consistent with torsion, not an ectopic pregnancy given negative pregnancy test.  No UTI.  Stable for close outpatient follow-up.   Sherrill Raring, PA-C 04/20/22 Lawton, Anthony, DO 04/20/22 [phone]

## 2022-04-20 NOTE — ED Triage Notes (Signed)
Here by POV from home for RLQ abd pain, onset 4d ago, also endorses vaginal d/c, chills, nausea, lower back pain, diarrhea and HA. Reports diarrhea x1 (loose) in last 24 hrs. Ibuprofen taken at 0200.

## 2022-04-20 NOTE — Discharge Instructions (Addendum)
This the CT today show multiple fibroids, youalso had a slight abnormal lesion on her right kidney that needs to be followed up in the outpatient setting.  Take the doxycycline twice daily for the next 10 days.  This antibiotic is to help with suspected pelvic inflammatory disease.  Follow-up with her gynecologist regarding the fibroid, return to the ED for any new or concerning symptoms.

## 2022-04-20 NOTE — ED Provider Notes (Signed)
[place] EMERGENCY DEPARTMENT AT [facility] Provider Note   CSN: [id number] Arrival date & time: 04/20/22  1117     History  Chief Complaint  Patient presents with   Abdominal Pain    Stacy Petersen is a 42 y.o. female.  Patient presents to the emergency department today for evaluation of right lower quadrant abdominal pain and vaginal discharge.  Symptoms started about 5 days ago and have gradually worsened.  Patient has noted a thick discharge.  No dysuria, increased frequency or urgency, hematuria.  No nausea, vomiting or diarrhea.  No history of abdominal surgeries other than pelvic surgery for uterine fibroids performed several months ago.  She states that she is not overly worried about sexually-transmitted infections.       Home Medications Prior to Admission medications   Medication Sig Start Date End Date Taking? Authorizing Provider  Cyanocobalamin (B-12 COMPLIANCE INJECTION IJ) Inject as directed every 30 (thirty) days.    [provider]  fexofenadine (ALLEGRA ALLERGY) 180 MG tablet Take 180 mg by mouth at bedtime.    [provider]  MAGNESIUM PO Take 1 tablet by mouth at bedtime.    [provider]  Multiple Vitamin (MULTIVITAMIN) capsule Take 1 capsule by mouth daily.    [provider]      Allergies    Patient has no known allergies.    Review of Systems   Review of Systems  Physical Exam Updated Vital Signs BP 135/81 (BP Location: Left Arm)   Pulse 72   Temp 98.4 F (36.9 C) (Oral)   Resp 16   Ht '5\' 4"'$  (1.626 m)   Wt 66.2 kg   LMP 04/08/2022 (Exact Date)   SpO2 99%   BMI 25.06 kg/m  Physical Exam Vitals and nursing note reviewed. Exam conducted with a chaperone present.  Constitutional:      General: She is not in acute distress.    Appearance: She is well-developed.  HENT:     Head: Normocephalic and atraumatic.     Right Ear: External ear normal.     Left Ear: External ear normal.     Nose:  Nose normal.  Eyes:     Conjunctiva/sclera: Conjunctivae normal.  Cardiovascular:     Rate and Rhythm: Normal rate and regular rhythm.     Heart sounds: No murmur heard. Pulmonary:     Effort: No respiratory distress.     Breath sounds: No wheezing, rhonchi or rales.  Abdominal:     Palpations: Abdomen is soft.     Tenderness: There is abdominal tenderness in the right lower quadrant. There is no guarding or rebound. Negative signs include Murphy's sign and McBurney's sign.  Genitourinary:    Pubic Area: No rash.      Labia:        Right: No lesion.        Left: No lesion.      Vagina: Vaginal discharge present.     Cervix: Discharge present. No cervical motion tenderness.     Uterus: Tender.      Adnexa:        Right: Tenderness present. No mass.         Left: No mass or tenderness.       Comments: Yellow-brown discharge noted Musculoskeletal:     Cervical back: Normal range of motion and neck supple.     Right lower leg: No edema.     Left lower leg: No edema.  Skin:  General: Skin is warm and dry.     Findings: No rash.  Neurological:     General: No focal deficit present.     Mental Status: She is alert. Mental status is at baseline.     Motor: No weakness.  Psychiatric:        Mood and Affect: Mood normal.     ED Results / Procedures / Treatments   Labs (all labs ordered are listed, but only abnormal results are displayed) Labs Reviewed  WET PREP, GENITAL  LIPASE, BLOOD  COMPREHENSIVE METABOLIC PANEL  URINALYSIS, ROUTINE W REFLEX MICROSCOPIC  CBC WITH DIFFERENTIAL/PLATELET  I-STAT BETA HCG BLOOD, ED ([facility], [facility], [facility] ONLY)  GC/CHLAMYDIA PROBE AMP ([place]) NOT AT [facility]    EKG None  Radiology No results found.  Procedures Procedures    Medications Ordered in ED Medications - No data to display  ED Course/ Medical Decision Making/ A&P    Patient seen and examined. History obtained directly from patient.  Will proceed with pelvic  exam.  Labs/EKG: Ordered CBC, CMP, lipase, UA, wet prep, GC and chlamydia.  Imaging: CT abdomen and pelvis  Medications/Fluids: None ordered  Most recent vital signs reviewed and are as follows: BP 135/81 (BP Location: Left Arm)   Pulse 72   Temp 98.4 F (36.9 C) (Oral)   Resp 16   Ht '5\' 4"'$  (1.626 m)   Wt 66.2 kg   LMP 04/08/2022 (Exact Date)   SpO2 99%   BMI 25.06 kg/m   Initial impression: Right lower quadrant abdominal pain, vaginal discharge.  Pelvic exam with right lower quadrant tenderness, no cervical motion tenderness.  Tenderness seems to be more superior on the abdomen.  Given this, will start with CT abdomen pelvis to fully evaluate for appendicitis.  4:01 PM Reassessment performed. Patient appears stable.  Comfortable.  Labs personally reviewed and interpreted including: CBC with differential unremarkable; CMP unremarkable; lipase normal; UA without compelling signs of infection; wet prep unremarkable.  Pregnancy also negative.  Imaging personally visualized and interpreted including: CT scan of abdomen and pelvis, awaiting results, no signs of appendicitis, uterine fibroids noted.  Reviewed pertinent lab work and imaging with patient at bedside. Questions answered.   Most current vital signs reviewed and are as follows: BP 112/67 (BP Location: Left Arm)   Pulse 72   Temp 97.8 F (36.6 C) (Oral)   Resp 18   Ht '5\' 4"'$  (1.626 m)   Wt 66.2 kg   LMP 04/08/2022 (Exact Date)   SpO2 99%   BMI 25.06 kg/m   Plan: Awaiting results.  Plan for PID coverage given worsening right lower quadrant tenderness and vaginal discharge.  No signs of sepsis.  No fevers.  Signout to [employer] at shift change.  Patient is up-to-date and agrees with plan.  Will give IM Rocephin and started on p.o. doxycycline with close outpatient GYN follow-up if no other abnormalities noted on CT imaging.                              Medical Decision Making Amount and/or Complexity of Data  Reviewed Labs: ordered. Radiology: ordered.  Risk Prescription drug management.   For this patient's complaint of abdominal pain, the following conditions were considered on the differential diagnosis: gastritis/PUD, enteritis/duodenitis, appendicitis, cholelithiasis/cholecystitis, cholangitis, pancreatitis, ruptured viscus, colitis, diverticulitis, small/large bowel obstruction, proctitis, cystitis, pyelonephritis, ureteral colic, aortic dissection, aortic aneurysm. In women, ectopic pregnancy, pelvic inflammatory disease, ovarian  cysts, and tubo-ovarian abscess were also considered. Atypical chest etiologies were also considered including ACS, PE, and pneumonia.         Final Clinical Impression(s) / ED Diagnoses Final diagnoses:  RLQ abdominal pain  Vaginal discharge    Rx / DC Orders ED Discharge Orders          Ordered    doxycycline (VIBRAMYCIN) 100 MG capsule  2 times daily        04/20/22 1559              Carlisle Cater, PA-C 04/20/22 1603    Tegeler, Gwenyth Allegra, MD 04/21/22 [phone]

## 2022-04-23 LAB — GC/CHLAMYDIA PROBE AMP (~~LOC~~) NOT AT ARMC
Chlamydia: NEGATIVE
Comment: NEGATIVE
Comment: NORMAL
Neisseria Gonorrhea: NEGATIVE

## 2022-12-09 IMAGING — MR MR LUMBAR SPINE W/O CM
4 of 5 series · 27 of 48 positions shown · non-contrast
Comparison: CT abdomen 04/26/2010

CLINICAL DATA: Right-sided low back pain radiating the abdomen.
Weakness and numbness of the right leg.

EXAM:
MRI LUMBAR SPINE WITHOUT CONTRAST
TECHNIQUE: Multiplanar, multisequence MR imaging of the lumbar spine was
performed. No intravenous contrast was administered.

[Series 3: T2 · sagittal · 4.0mm · 1.09mm/px · 6 of 15 slices shown (1 of 2)]
[im 1/15]
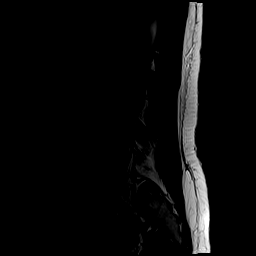
[im 3/15]
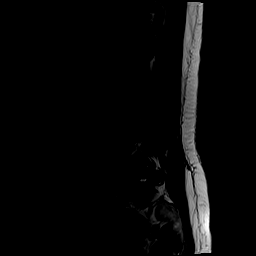
[im 6/15]
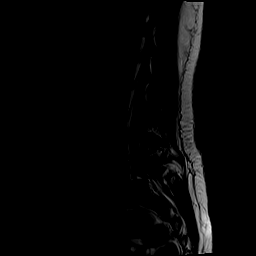
[im 9/15]
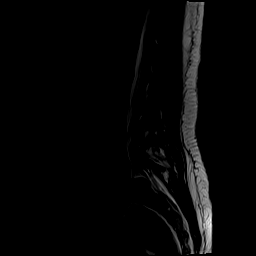
[im 12/15]
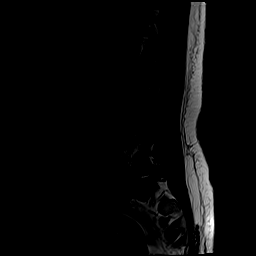
[im 15/15]
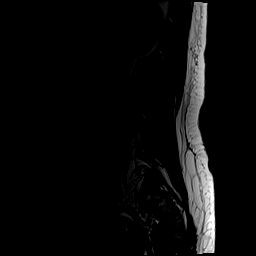

[Series 5: T1 · sagittal · 4.0mm · 1.09mm/px · 6 of 15 slices shown (1 of 2)]
[im 1/15]
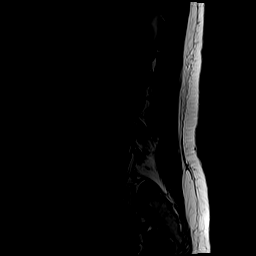
[im 3/15]
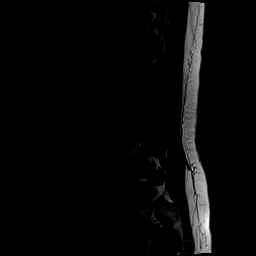
[im 6/15]
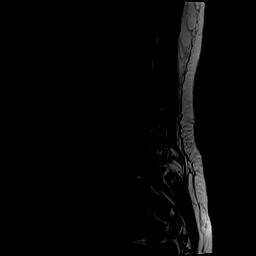
[im 9/15]
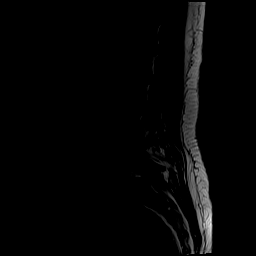
[im 12/15]
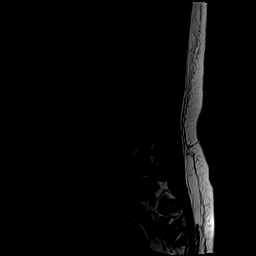
[im 15/15]
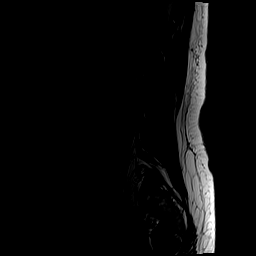

[Series 6: T2 · axial · 4.0mm · 0.39mm/px · z∈[+3,+198]mm · 9 of 40 slices shown (2 of 2)]
[im 1/40]
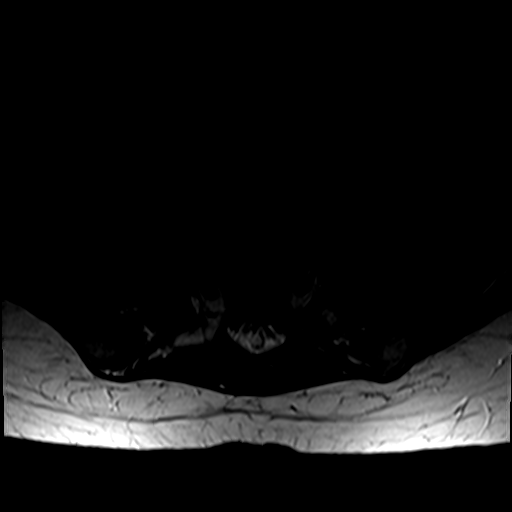
[im 6/40]
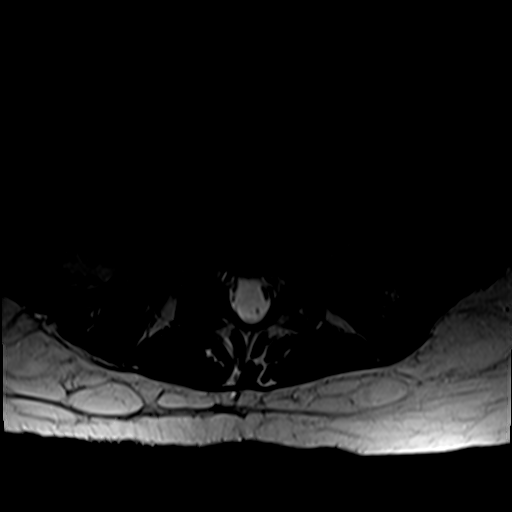
[im 12/40]
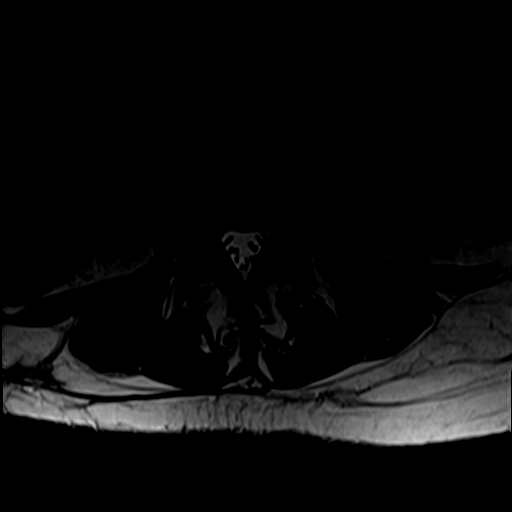
[im 17/40]
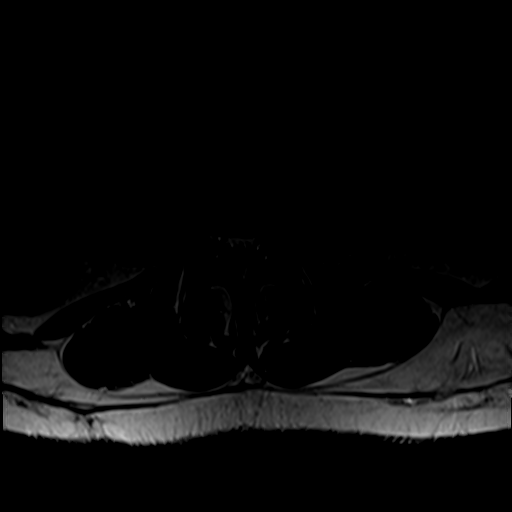
[im 20/40]
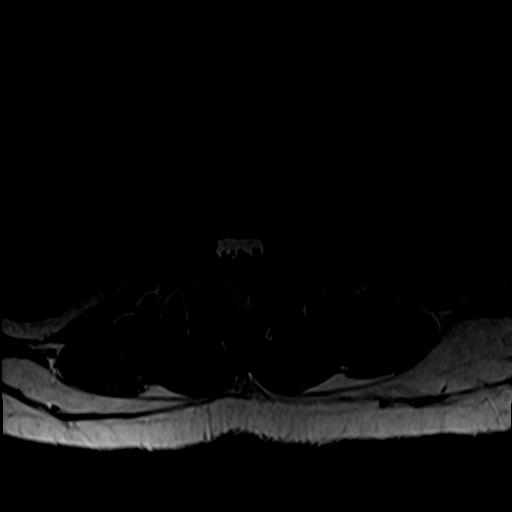
[im 23/40]
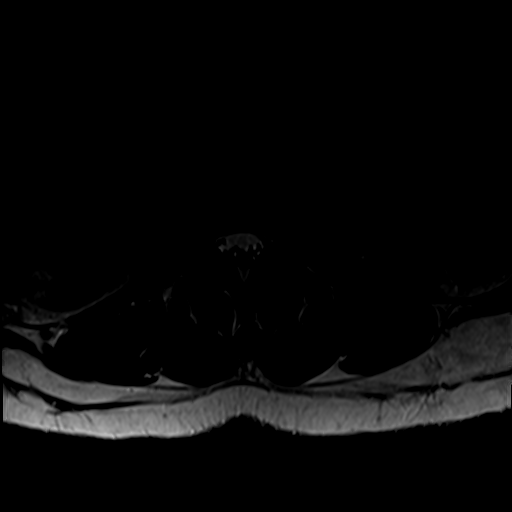
[im 28/40]
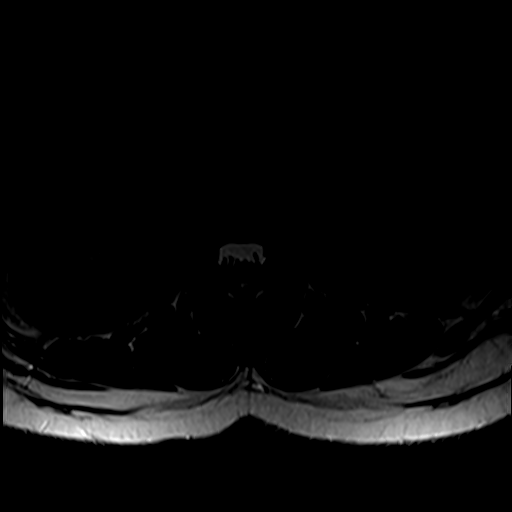
[im 34/40]
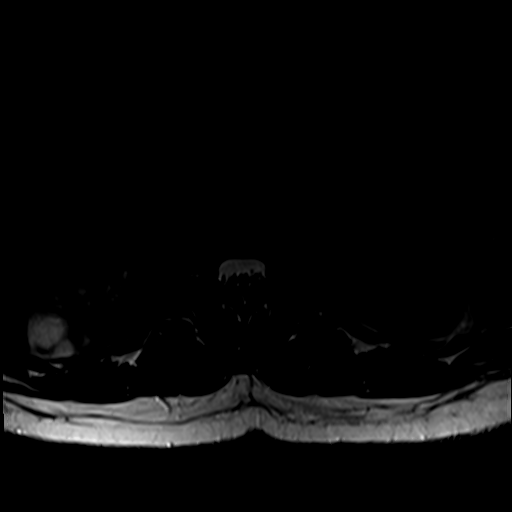
[im 40/40]
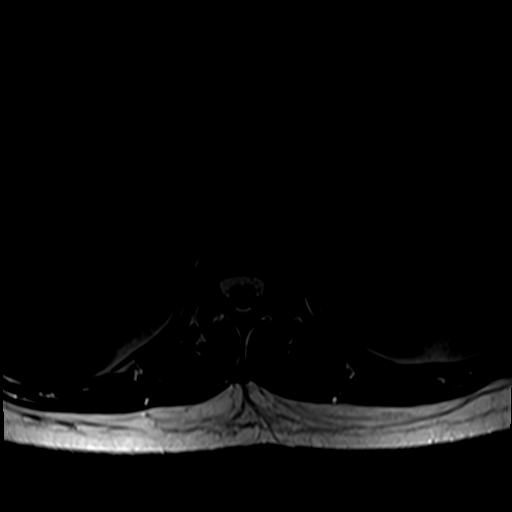

[Series 7: T1 · axial · 4.0mm · 0.39mm/px · z∈[+3,+170]mm · 6 of 40 slices shown (2 of 2)]
[im 1/40]
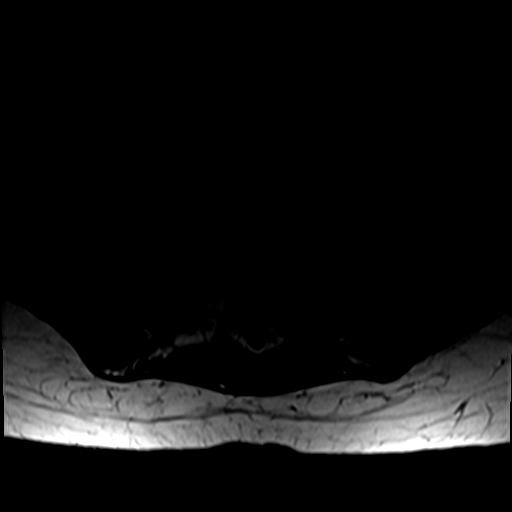
[im 6/40]
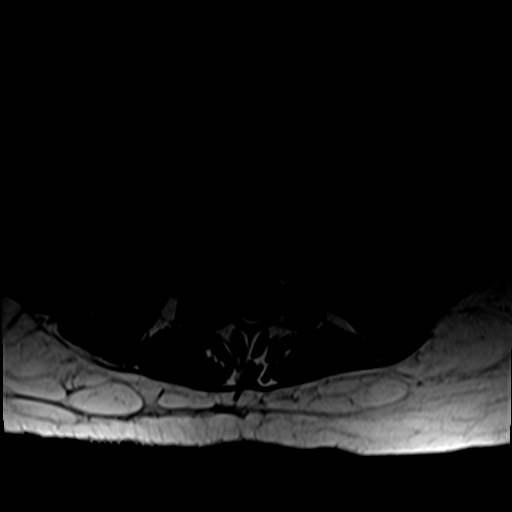
[im 12/40]
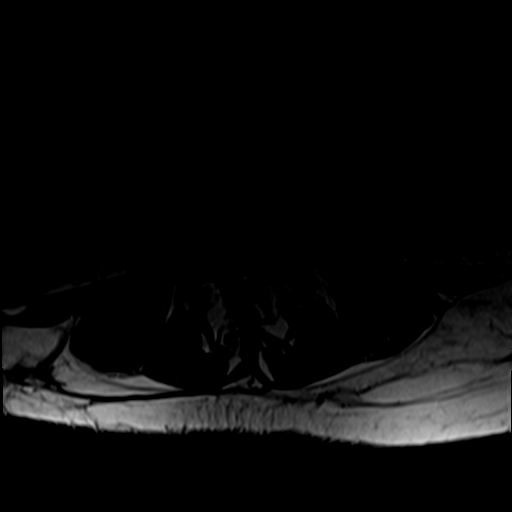
[im 17/40]
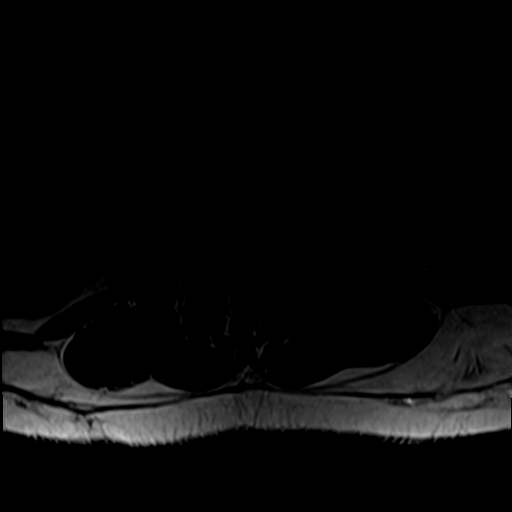
[im 20/40]
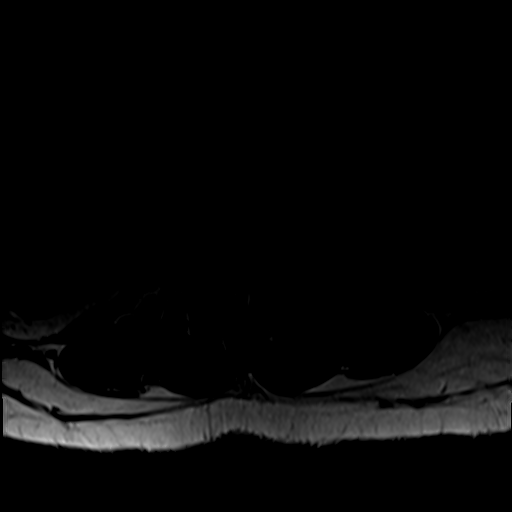
[im 34/40]
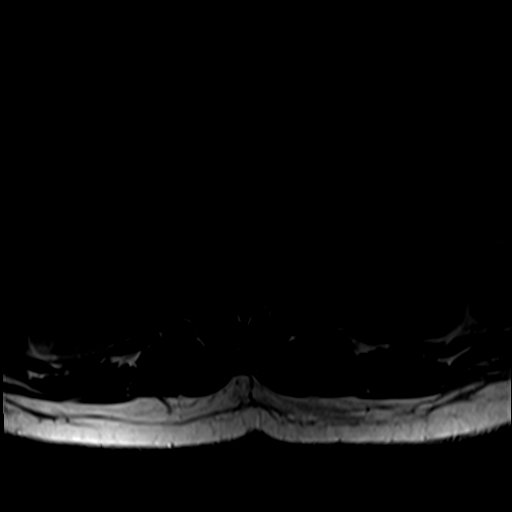

[27 of 48 positions shown; findings below may reference images not displayed]

FINDINGS: Segmentation:  5 lumbar type vertebral bodies.

Alignment:  1 cm spondylolisthesis L5-S1.

Vertebrae: No other focal bone finding. Chronic discogenic endplate
marrow changes at L5-S1 without active edema.

Conus medullaris and cauda equina: Conus extends to the L1 level.
Conus and cauda equina appear normal.

Paraspinal and other soft tissues: Multiple leiomyomas of the
uterus.

Disc levels:

No abnormality from T11-12 through L2-3.

L3-4: Annular bulging.  No compressive stenosis.

L4-5: Small central disc bulge.  No compressive stenosis.

L5-S1: Chronic bilateral pars defects with 1 cm of anterolisthesis.
Chronic disc degeneration with loss of disc height. No compressive
stenosis of the canal. Bilateral foraminal stenosis that could
compress either or both L5 nerves.
IMPRESSION: L5-S1: Chronic bilateral pars defects with 1 cm of
spondylolisthesis. Chronic disc degeneration. Bilateral foraminal
stenosis that could compress either or both L5 nerves. No central
canal stenosis.

Annular bulge at L3-4 without apparent neural compression.

Central disc bulge at L4-5 without apparent neural compression.

Multiple leiomyomas of the uterus.
